# Patient Record
Sex: Female | Born: 1975 | Race: Black or African American | Hispanic: No | Marital: Married | Smoking: Never smoker
Health system: Southern US, Community
[De-identification: ages and names within clinical notes are randomized; demographics above are authoritative.]

---

## 2017-04-18 ENCOUNTER — Emergency Department (HOSPITAL_COMMUNITY)
Admission: EM | Admit: 2017-04-18 | Discharge: 2017-04-18 | Disposition: A | Discharge status: Home / Self Care | Payer: Self-pay | Attending: Emergency Medicine | Admitting: Emergency Medicine

## 2017-04-18 ENCOUNTER — Encounter (HOSPITAL_COMMUNITY): Payer: Self-pay | Admitting: *Deleted

## 2017-04-18 ENCOUNTER — Emergency Department (HOSPITAL_COMMUNITY): Payer: Self-pay

## 2017-04-18 DIAGNOSIS — M25551 Pain in right hip: Secondary | ICD-10-CM | POA: Insufficient documentation

## 2017-04-18 LAB — POC URINE PREG, ED: Preg Test, Ur: NEGATIVE

## 2017-04-18 MED ORDER — OXYCODONE-ACETAMINOPHEN 5-325 MG PO TABS
1.0000 | ORAL_TABLET | Freq: Once | ORAL | Status: AC
Start: 2017-04-18 — End: 2017-04-18
  Administered 2017-04-18: 1 via ORAL
  Filled 2017-04-18: qty 1

## 2017-04-18 MED ORDER — IBUPROFEN 600 MG PO TABS: 600.0000 mg | ORAL_TABLET | Freq: Four times a day (QID) | ORAL | 0 refills | Status: AC | PRN

## 2017-04-18 MED ORDER — METHOCARBAMOL 500 MG PO TABS: 500.0000 mg | ORAL_TABLET | Freq: Two times a day (BID) | ORAL | 0 refills | Status: AC

## 2017-04-18 MED ORDER — OXYCODONE-ACETAMINOPHEN 5-325 MG PO TABS: 1.0000 | ORAL_TABLET | ORAL | 0 refills | Status: AC | PRN

## 2017-04-18 NOTE — ED Triage Notes (Signed)
The pt is c/o  Rt hip pain for 3 weeks no known injury   lmp   July 14

## 2017-04-18 NOTE — ED Notes (Signed)
Patient transported to X-ray 

## 2017-04-18 NOTE — Discharge Instructions (Signed)
Take your medications as prescribed. You may also apply ice and/or heat to affected area for 15-20 minutes 3-4 times daily for additional pain relief. I recommend refraining from doing any heavy lifting, squatting or repetitive movements that exacerbate her symptoms for the next few days. Follow-up with the orthopedist listed below if symptoms do not improve over the next week. Please return to the Emergency Department if symptoms worsen or new onset of denies fever, numbness, tingling, groin anesthesia, loss of bowel or bladder, weakness, chest pain, abdominal pain, vomiting.

## 2017-04-18 NOTE — ED Provider Notes (Signed)
[facility]-EMERGENCY DEPT Provider Note   CSN: [id number] Arrival date & time: 04/18/17  2103  By signing my name below, I, Ny'Kea Lewis, attest that this documentation has been prepared under the direction and in the presence of Melburn Hake, PA-C. Electronically Signed: Karren Cobble, ED Scribe. 04/18/17. 10:43 PM.  History   Chief Complaint Chief Complaint  Patient presents with  . Hip Pain   The history is provided by the patient. No language interpreter was used.    HPI Comments: Maria Medina is a 41 y.o. female with no pertinent history, who presents to the Emergency Department complaining of sudden onset, gradually worsening, persistent, dull lateral non-radiating right-sided hip pain that began three weeks ago, but worsened a few days ago.  Pt notes associated spasming pain and an intermittent "knot" to her right hip area. She reports when the pain initially started it was dull and intermittent, but now it has become persistent. Denies recent tramuatic injuries. Her pain is exacerbated with ambulation. She reports she is unable to bear her full weight onto her right hip. She tried taking Tramadol, Norco, and Tylenol at home with no relief. Denies abdominal pain, neck or back pain, swelling, bruising, numbness, weakness, or rashes to the extremities.   History reviewed. No pertinent past medical history.  There are no active problems to display for this patient.  History reviewed. No pertinent surgical history.  OB History    No data available     Home Medications    Prior to Admission medications   Medication Sig Start Date End Date Taking? Authorizing Provider  ibuprofen (ADVIL,MOTRIN) 600 MG tablet Take 1 tablet (600 mg total) by mouth every 6 (six) hours as needed. 04/18/17   Barrett Henle, PA-C  methocarbamol (ROBAXIN) 500 MG tablet Take 1 tablet (500 mg total) by mouth 2 (two) times daily. 04/18/17   Barrett Henle, PA-C  oxyCODONE-acetaminophen  (PERCOCET/ROXICET) 5-325 MG tablet Take 1 tablet by mouth every 4 (four) hours as needed for severe pain. 04/18/17   Barrett Henle, PA-C   Family History No family history on file.  Social History Social History  Substance Use Topics  . Smoking status: Never Smoker  . Smokeless tobacco: Never Used  . Alcohol use No   Allergies   Patient has no known allergies.  Review of Systems Review of Systems  Gastrointestinal: Negative for abdominal pain.  Musculoskeletal: Positive for arthralgias. Negative for back pain and neck pain.  Skin: Negative for rash.  Hematological: Does not bruise/bleed easily.   Physical Exam Updated Vital Signs BP 123/69 (BP Location: Right Arm)   Pulse 78   Temp 98.2 F (36.8 C)   Resp 16   Ht 5\' 4"  (1.626 m)   Wt 102.5 kg (226 lb)   LMP 04/11/2017   SpO2 100%   BMI 38.79 kg/m   Physical Exam  Constitutional: She is oriented to person, place, and time. She appears well-developed and well-nourished.  HENT:  Head: Normocephalic and atraumatic.  Eyes: Conjunctivae and EOM are normal. Right eye exhibits no discharge. Left eye exhibits no discharge. No scleral icterus.  Neck: Normal range of motion. Neck supple.  Cardiovascular: Normal rate, regular rhythm, normal heart sounds and intact distal pulses.   Pulmonary/Chest: Effort normal and breath sounds normal.  Abdominal: Soft. She exhibits no distension. There is no tenderness.  Musculoskeletal: She exhibits tenderness. She exhibits no edema or deformity.       Right hip: She exhibits tenderness. She exhibits normal  range of motion, normal strength, no swelling, no crepitus, no deformity and no laceration.  No midline C, T, or L tenderness. Full range of motion of neck and back. Full range of motion of bilateral upper and lower extremities, with 5/5 strength. Sensation intact. 2+ radial and PT pulses. Cap refill <2 seconds.   TTP over right anterior lateral and posterior hip/buttock region.  TTP over right trochanteric bursa. No pelvic instability.Full ROM of right hip, knee, ankle, and foot, but report pain with full flexion and extension of right hip.  Pt unable to ambulate without assistance due to reported pain when bearing weight on right leg.    Neurological: She is alert and oriented to person, place, and time.  Skin: Skin is warm and dry. Capillary refill takes less than 2 seconds. She is not diaphoretic.  Nursing note and vitals reviewed.  ED Treatments / Results  DIAGNOSTIC STUDIES: Oxygen Saturation is 99% on RA, normal by my interpretation.   COORDINATION OF CARE: 10:19 PM-Discussed next steps with pt. Pt verbalized understanding and is agreeable with the plan.   Labs (all labs ordered are listed, but only abnormal results are displayed) Labs Reviewed  POC URINE PREG, ED    EKG  EKG Interpretation None       Radiology Dg Hip Unilat W Or Wo Pelvis 2-3 Views Right  Result Date: 04/18/2017 CLINICAL DATA:  Right hip pain.  No known injury. EXAM: DG HIP (WITH OR WITHOUT PELVIS) 2-3V RIGHT COMPARISON:  None. FINDINGS: There is no evidence of hip fracture or dislocation. There is no evidence of arthropathy or other focal bone abnormality. IMPRESSION: No fracture or dislocation of the right hip. Electronically Signed   By: Deatra RobinsonKevin  Herman M.D.   On: 04/18/2017 23:22    Procedures Procedures (including critical care time)  Medications Ordered in ED Medications  oxyCODONE-acetaminophen (PERCOCET/ROXICET) 5-325 MG per tablet 1 tablet (1 tablet Oral Given 04/18/17 2228)     Initial Impression / Assessment and Plan / ED Course  I have reviewed the triage vital signs and the nursing notes.  Pertinent labs & imaging results that were available during my care of the patient were reviewed by me and considered in my medical decision making (see chart for details).     Patient presents with gradually worsening right hip pain over the past 3 weeks. Denies any  specific fall, trauma or injury. Denies back pain.  VSS. Exam revealed tenderness over anterior, lateral and posterior hip with exquisite point tenderness over trochanteric bursa. No midline spinal tenderness. No back pain red flags. No neuro deficits. Patient X-Ray negative for obvious fracture or dislocation. Pain managed in ED. suspect patient's symptoms are likely related to muscle strain versus bursitis. Pt advised to follow up with orthopedics if symptoms persist. Plan to d/c pt home with pain meds, conservative therapy recommended and discussed. Patient will be dc home & is agreeable with above plan.   Final Clinical Impressions(s) / ED Diagnoses   Final diagnoses:  Right hip pain    New Prescriptions New Prescriptions   IBUPROFEN (ADVIL,MOTRIN) 600 MG TABLET    Take 1 tablet (600 mg total) by mouth every 6 (six) hours as needed.   METHOCARBAMOL (ROBAXIN) 500 MG TABLET    Take 1 tablet (500 mg total) by mouth 2 (two) times daily.   OXYCODONE-ACETAMINOPHEN (PERCOCET/ROXICET) 5-325 MG TABLET    Take 1 tablet by mouth every 4 (four) hours as needed for severe pain.   I personally performed  the services described in this documentation, which was scribed in my presence. The recorded information has been reviewed and is accurate.     Barrett Henle, PA-C 04/18/17 2340    Jerelyn Scott, MD 04/18/17 [phone]

## 2017-11-21 IMAGING — DX DG HIP (WITH OR WITHOUT PELVIS) 2-3V*R*
3 series · 3 of 3 positions shown · non-contrast
Comparison: None.

CLINICAL DATA: Right hip pain.  No known injury.

EXAM:
DG HIP (WITH OR WITHOUT PELVIS) 2-3V RIGHT

[pelvis ap]
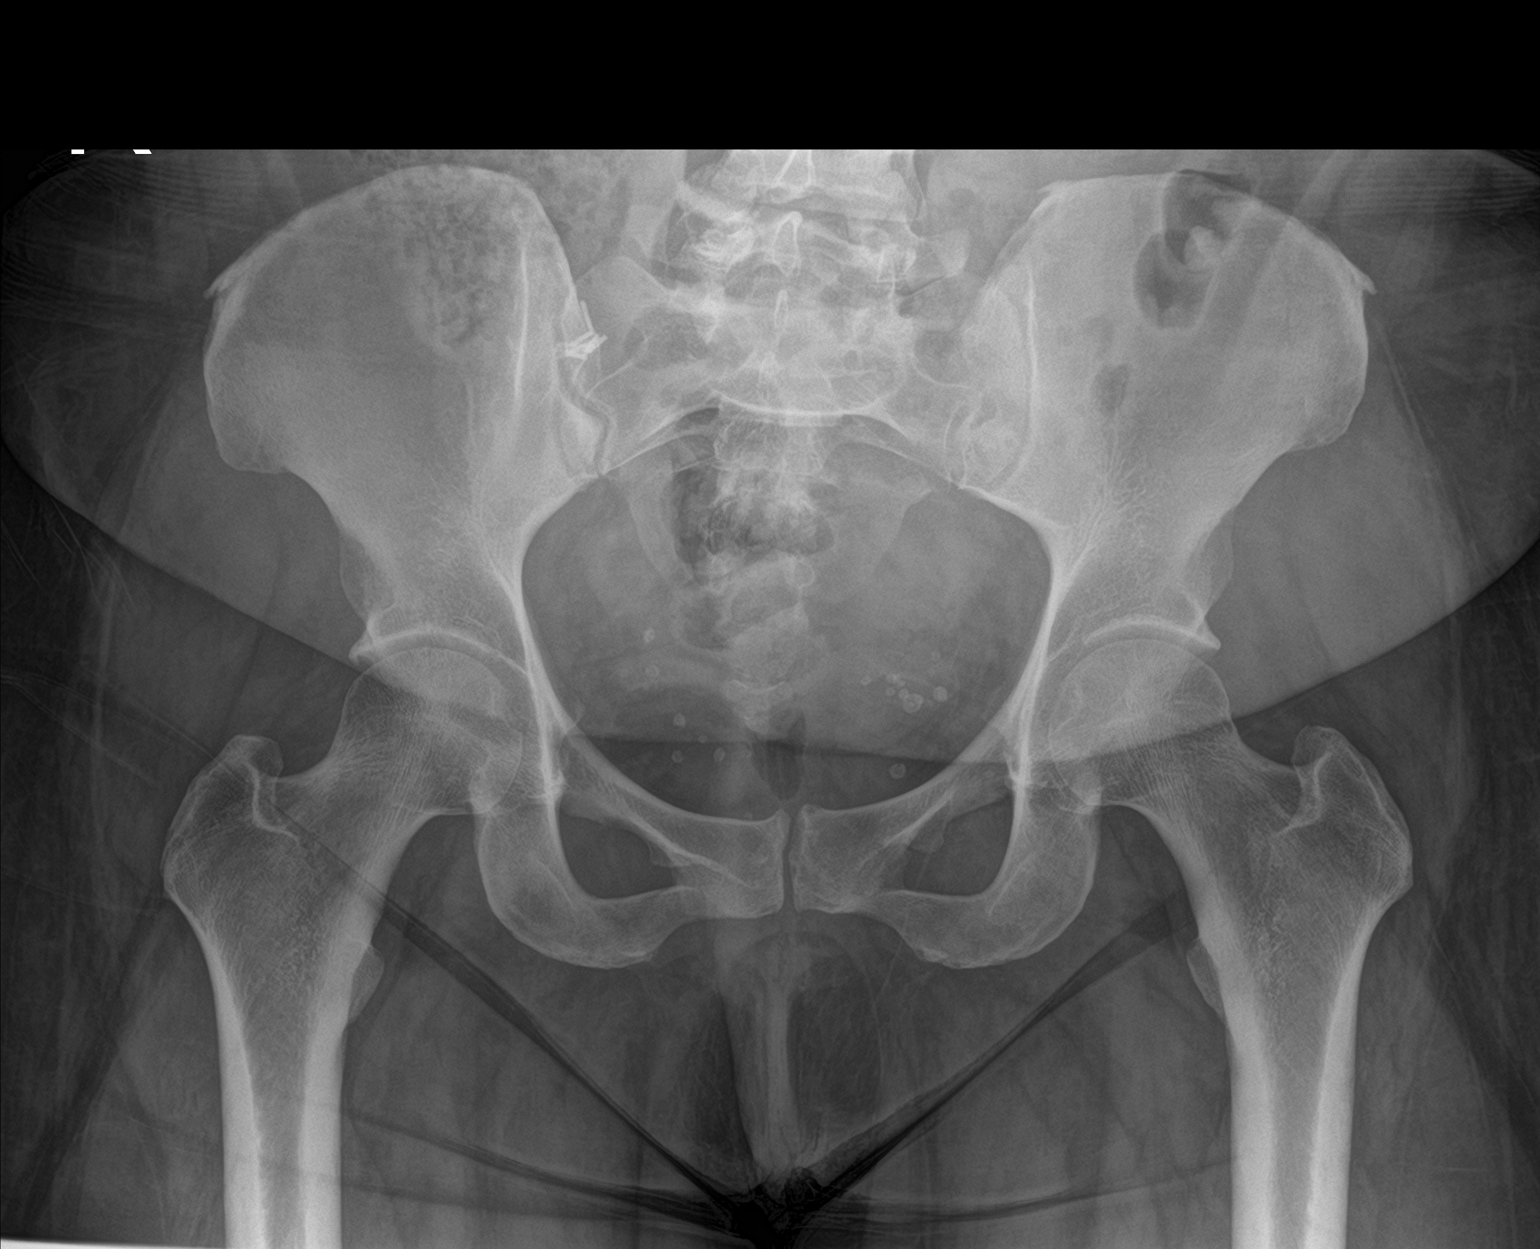

[hip ap]
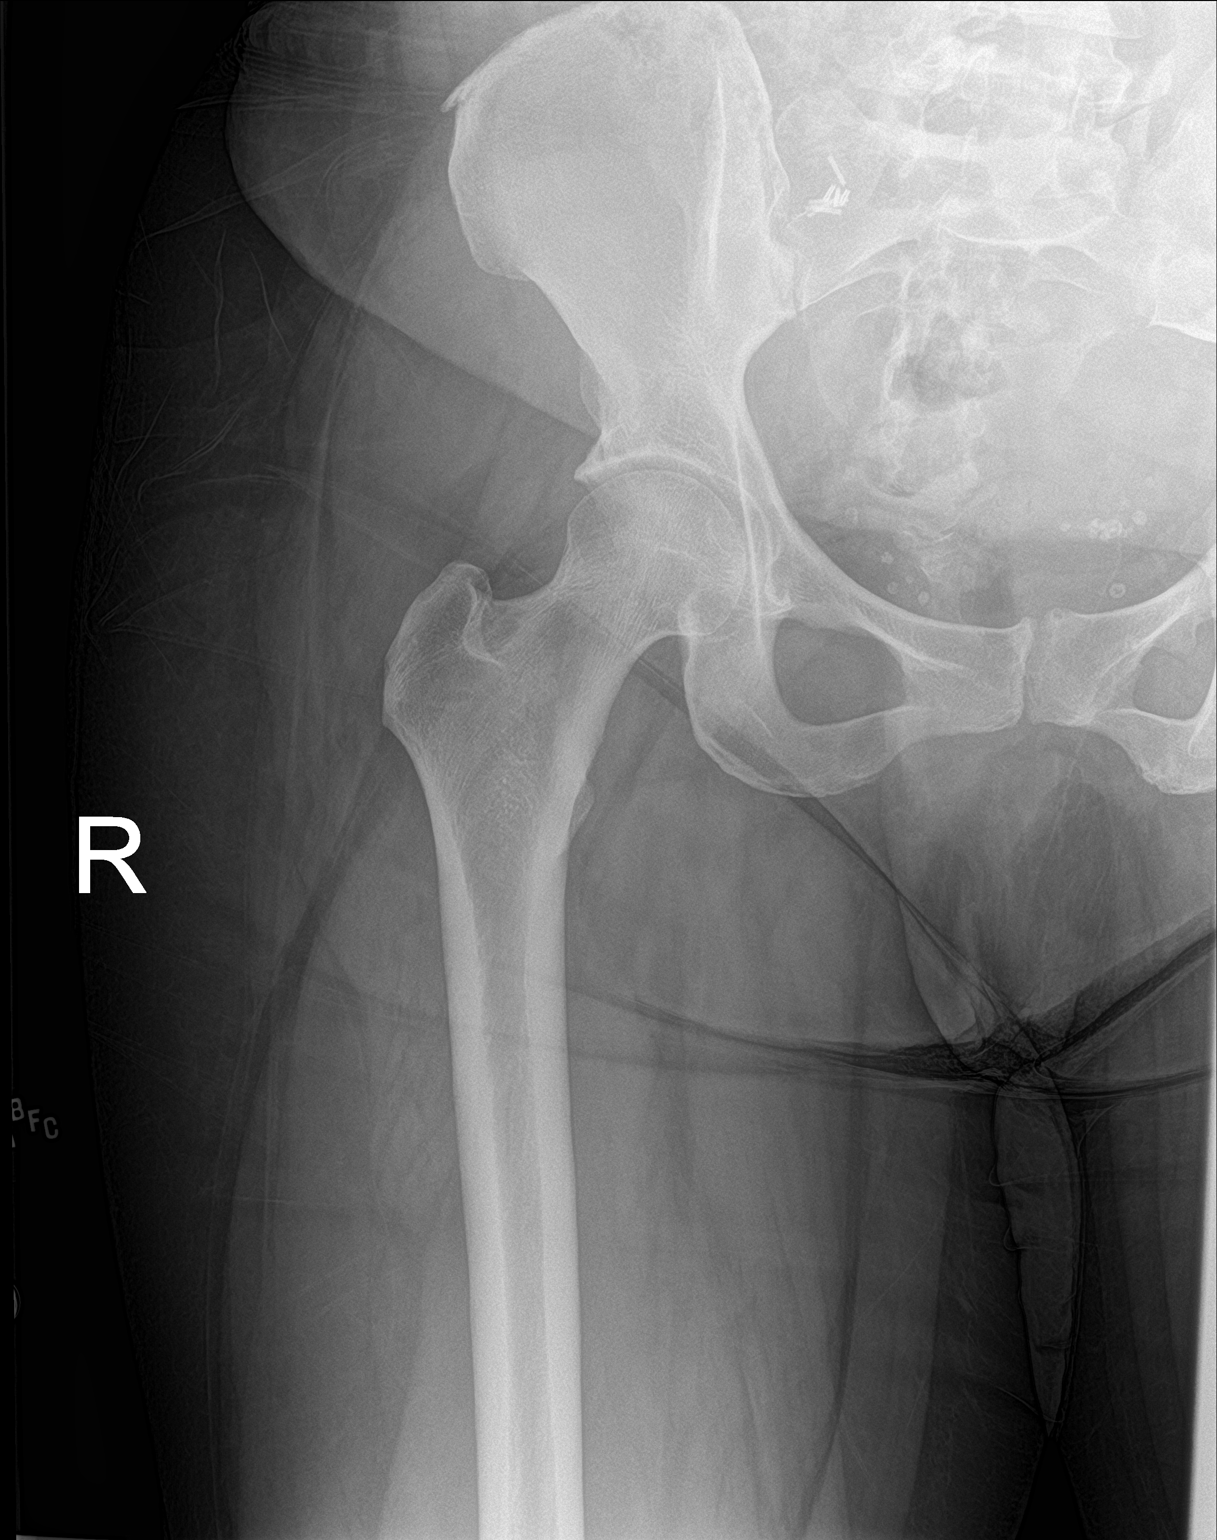

[hip lat]
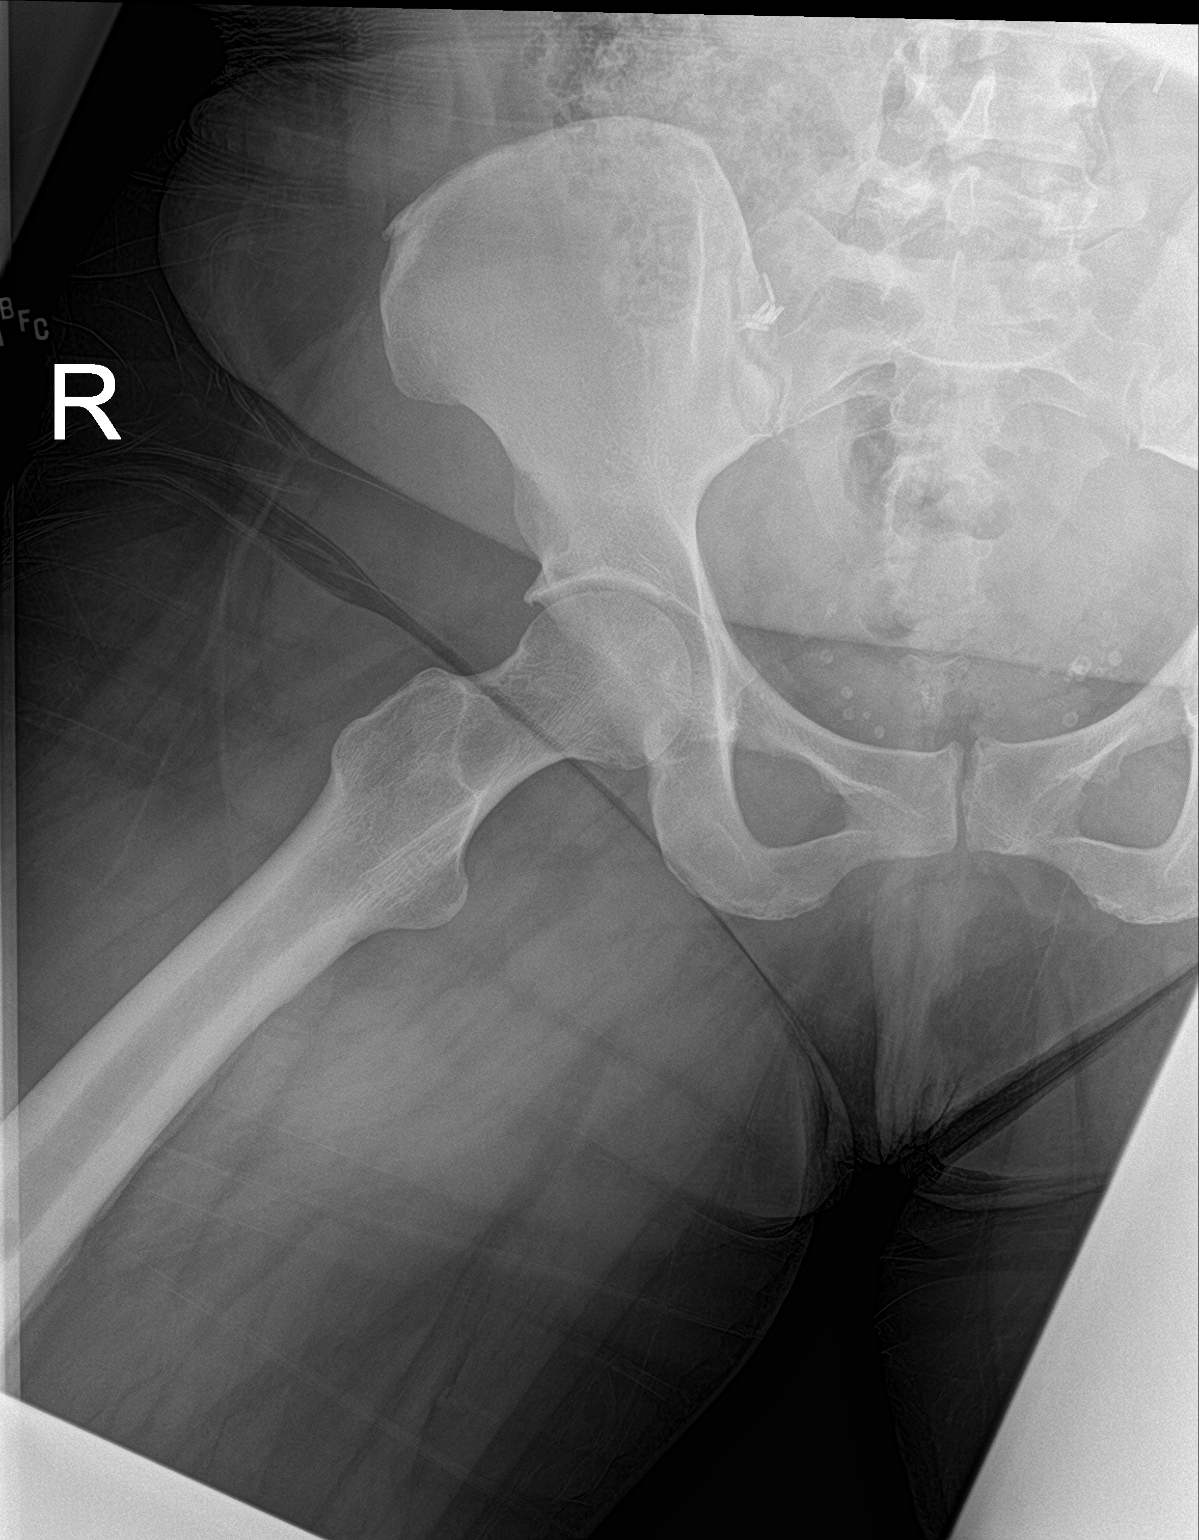

[3 of 3 positions shown; findings below may reference images not displayed]

FINDINGS: There is no evidence of hip fracture or dislocation. There is no
evidence of arthropathy or other focal bone abnormality.
IMPRESSION: No fracture or dislocation of the right hip.
